# Patient Record
Sex: Male | Born: 1965 | Race: White | Hispanic: No | Marital: Married | Smoking: Current some day smoker
Health system: Southern US, Community
[De-identification: ages and names within clinical notes are randomized; demographics above are authoritative.]

## PROBLEM LIST (undated history)

## (undated) DIAGNOSIS — F4310 Post-traumatic stress disorder, unspecified: Secondary | ICD-10-CM

## (undated) DIAGNOSIS — Z9289 Personal history of other medical treatment: Secondary | ICD-10-CM

## (undated) DIAGNOSIS — F32A Depression, unspecified: Secondary | ICD-10-CM

## (undated) DIAGNOSIS — F329 Major depressive disorder, single episode, unspecified: Secondary | ICD-10-CM

## (undated) HISTORY — PX: TONSILLECTOMY: SUR1361

## (undated) HISTORY — PX: OTHER SURGICAL HISTORY: SHX169

---
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
## 2005-01-02 ENCOUNTER — Emergency Department (HOSPITAL_COMMUNITY): Admission: EM | Admit: 2005-01-02 | Discharge: 2005-01-02 | Payer: Self-pay | Admitting: Emergency Medicine

## 2008-11-20 HISTORY — PX: LEG AMPUTATION ABOVE KNEE: SHX117

## 2009-02-20 ENCOUNTER — Ambulatory Visit: Payer: Self-pay | Admitting: Surgery

## 2009-02-20 ENCOUNTER — Inpatient Hospital Stay (HOSPITAL_COMMUNITY): Admission: EM | Admit: 2009-02-20 | Discharge: 2009-03-03 | Payer: Self-pay | Admitting: Emergency Medicine

## 2009-04-12 ENCOUNTER — Inpatient Hospital Stay (HOSPITAL_COMMUNITY): Admission: RE | Admit: 2009-04-12 | Discharge: 2009-04-16 | Payer: Self-pay | Admitting: Orthopedic Surgery

## 2009-05-10 ENCOUNTER — Ambulatory Visit: Payer: Self-pay | Admitting: Vascular Surgery

## 2009-05-19 ENCOUNTER — Encounter: Admission: RE | Admit: 2009-05-19 | Discharge: 2009-08-17 | Payer: Self-pay | Admitting: Orthopedic Surgery

## 2009-06-04 ENCOUNTER — Emergency Department (HOSPITAL_COMMUNITY): Admission: EM | Admit: 2009-06-04 | Discharge: 2009-06-04 | Payer: Self-pay | Admitting: Emergency Medicine

## 2009-06-07 ENCOUNTER — Encounter: Admission: RE | Admit: 2009-06-07 | Discharge: 2009-06-07 | Payer: Self-pay | Admitting: Orthopedic Surgery

## 2009-06-17 ENCOUNTER — Ambulatory Visit: Payer: Self-pay | Admitting: Infectious Diseases

## 2009-06-17 DIAGNOSIS — M86669 Other chronic osteomyelitis, unspecified tibia and fibula: Secondary | ICD-10-CM | POA: Insufficient documentation

## 2009-06-24 ENCOUNTER — Ambulatory Visit: Payer: Self-pay | Admitting: Vascular Surgery

## 2009-06-25 ENCOUNTER — Telehealth: Payer: Self-pay | Admitting: Infectious Diseases

## 2009-08-19 ENCOUNTER — Encounter: Admission: RE | Admit: 2009-08-19 | Discharge: 2009-09-16 | Payer: Self-pay | Admitting: Orthopedic Surgery

## 2009-09-06 ENCOUNTER — Encounter: Admission: RE | Admit: 2009-09-06 | Discharge: 2009-09-06 | Payer: Self-pay | Admitting: Orthopedic Surgery

## 2010-12-07 ENCOUNTER — Ambulatory Visit: Payer: Self-pay | Admitting: Vascular Surgery

## 2011-02-07 ENCOUNTER — Ambulatory Visit (HOSPITAL_COMMUNITY)
Admission: RE | Admit: 2011-02-07 | Discharge: 2011-02-07 | Disposition: A | Discharge status: Home / Self Care | Payer: Medicaid Other | Source: Ambulatory Visit | Attending: Orthopedic Surgery | Admitting: Orthopedic Surgery

## 2011-02-07 ENCOUNTER — Other Ambulatory Visit (HOSPITAL_COMMUNITY): Payer: Self-pay | Admitting: Orthopedic Surgery

## 2011-02-07 ENCOUNTER — Encounter (HOSPITAL_COMMUNITY)
Admission: RE | Admit: 2011-02-07 | Discharge: 2011-02-07 | Disposition: A | Discharge status: Home / Self Care | Payer: Medicaid Other | Source: Ambulatory Visit | Attending: Orthopedic Surgery | Admitting: Orthopedic Surgery

## 2011-02-07 DIAGNOSIS — Z01810 Encounter for preprocedural cardiovascular examination: Secondary | ICD-10-CM | POA: Insufficient documentation

## 2011-02-07 DIAGNOSIS — Z01818 Encounter for other preprocedural examination: Secondary | ICD-10-CM | POA: Insufficient documentation

## 2011-02-07 DIAGNOSIS — Z01812 Encounter for preprocedural laboratory examination: Secondary | ICD-10-CM | POA: Insufficient documentation

## 2011-02-07 DIAGNOSIS — Z01811 Encounter for preprocedural respiratory examination: Secondary | ICD-10-CM

## 2011-02-07 LAB — TYPE AND SCREEN: ABO/RH(D): O POS

## 2011-02-07 LAB — COMPREHENSIVE METABOLIC PANEL
ALT: 16 U/L (ref 0–53)
AST: 19 U/L (ref 0–37)
Albumin: 4.1 g/dL (ref 3.5–5.2)
Alkaline Phosphatase: 91 U/L (ref 39–117)
CO2: 29 mEq/L (ref 19–32)
Chloride: 106 mEq/L (ref 96–112)
Creatinine, Ser: 0.84 mg/dL (ref 0.4–1.5)
GFR calc Af Amer: >60 mL/min (ref >60)
GFR calc non Af Amer: >60 mL/min (ref >60)
Potassium: 4.4 mEq/L (ref 3.5–5.1)
Sodium: 144 mEq/L (ref 135–145)
Total Bilirubin: 0.4 mg/dL (ref 0.3–1.2)

## 2011-02-07 LAB — URINALYSIS, ROUTINE W REFLEX MICROSCOPIC
Bilirubin Urine: NEGATIVE
Glucose, UA: NEGATIVE mg/dL
Hgb urine dipstick: NEGATIVE
Ketones, ur: NEGATIVE mg/dL
Protein, ur: NEGATIVE mg/dL
Urobilinogen, UA: 0.2 mg/dL (ref 0.0–1.0)

## 2011-02-07 LAB — DIFFERENTIAL
Eosinophils Relative: 2 % (ref 0–5)
Lymphocytes Relative: 38 % (ref 12–46)
Monocytes Absolute: 0.4 10*3/uL (ref 0.1–1.0)
Monocytes Relative: 6 % (ref 3–12)
Neutro Abs: 4.3 10*3/uL (ref 1.7–7.7)

## 2011-02-07 LAB — CBC
HCT: 43.7 % (ref 39.0–52.0)
Hemoglobin: 14.6 g/dL (ref 13.0–17.0)
MCH: 30.2 pg (ref 26.0–34.0)
MCHC: 33.4 g/dL (ref 30.0–36.0)
MCV: 90.3 fL (ref 78.0–100.0)
RDW: 13.4 % (ref 11.5–15.5)

## 2011-02-07 LAB — APTT: aPTT: 29 seconds (ref 24–37)

## 2011-02-08 LAB — URINE CULTURE
Colony Count: NO GROWTH
Culture  Setup Time: 201203201125

## 2011-02-09 ENCOUNTER — Inpatient Hospital Stay (HOSPITAL_COMMUNITY): Admit: 2011-02-09 | Payer: Self-pay | Admitting: Orthopedic Surgery

## 2011-02-09 ENCOUNTER — Inpatient Hospital Stay (HOSPITAL_COMMUNITY): Payer: Medicaid Other

## 2011-02-09 ENCOUNTER — Inpatient Hospital Stay (HOSPITAL_COMMUNITY)
Admission: RE | Admit: 2011-02-09 | Discharge: 2011-02-10 | DRG: 475 | Disposition: A | Discharge status: Home / Self Care | Payer: Medicaid Other | Source: Ambulatory Visit | Attending: Orthopedic Surgery | Admitting: Orthopedic Surgery

## 2011-02-09 ENCOUNTER — Other Ambulatory Visit: Payer: Self-pay | Admitting: Orthopedic Surgery

## 2011-02-09 DIAGNOSIS — S8290XS Unspecified fracture of unspecified lower leg, sequela: Secondary | ICD-10-CM

## 2011-02-09 DIAGNOSIS — M12569 Traumatic arthropathy, unspecified knee: Principal | ICD-10-CM | POA: Diagnosis present

## 2011-02-09 DIAGNOSIS — F411 Generalized anxiety disorder: Secondary | ICD-10-CM | POA: Diagnosis present

## 2011-02-09 DIAGNOSIS — M869 Osteomyelitis, unspecified: Secondary | ICD-10-CM | POA: Diagnosis present

## 2011-02-09 DIAGNOSIS — F17200 Nicotine dependence, unspecified, uncomplicated: Secondary | ICD-10-CM | POA: Diagnosis present

## 2011-02-09 DIAGNOSIS — IMO0002 Reserved for concepts with insufficient information to code with codable children: Secondary | ICD-10-CM | POA: Diagnosis present

## 2011-02-09 DIAGNOSIS — B9689 Other specified bacterial agents as the cause of diseases classified elsewhere: Secondary | ICD-10-CM | POA: Diagnosis present

## 2011-02-09 LAB — GRAM STAIN

## 2011-02-10 LAB — CBC
Hemoglobin: 10.8 g/dL — ABNORMAL LOW (ref 13.0–17.0)
MCH: 29 pg (ref 26.0–34.0)
MCHC: 32.5 g/dL (ref 30.0–36.0)
Platelets: 314 10*3/uL (ref 150–400)
RBC: 3.72 MIL/uL — ABNORMAL LOW (ref 4.22–5.81)

## 2011-02-10 LAB — COMPREHENSIVE METABOLIC PANEL
ALT: 13 U/L (ref 0–53)
AST: 14 U/L (ref 0–37)
Albumin: 3.4 g/dL — ABNORMAL LOW (ref 3.5–5.2)
CO2: 32 mEq/L (ref 19–32)
Calcium: 8.8 mg/dL (ref 8.4–10.5)
Chloride: 105 mEq/L (ref 96–112)
Creatinine, Ser: 0.67 mg/dL (ref 0.4–1.5)
GFR calc Af Amer: >60 mL/min (ref >60)
GFR calc non Af Amer: >60 mL/min (ref >60)
Sodium: 141 mEq/L (ref 135–145)

## 2011-02-11 LAB — WOUND CULTURE
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
## 2011-02-14 LAB — ANAEROBIC CULTURE

## 2011-02-28 LAB — BASIC METABOLIC PANEL
BUN: 6 mg/dL (ref 6–23)
BUN: 9 mg/dL (ref 6–23)
CO2: 31 mEq/L (ref 19–32)
Calcium: 8.1 mg/dL — ABNORMAL LOW (ref 8.4–10.5)
Chloride: 103 mEq/L (ref 96–112)
Chloride: 104 mEq/L (ref 96–112)
Creatinine, Ser: 0.83 mg/dL (ref 0.4–1.5)
Creatinine, Ser: 1 mg/dL (ref 0.4–1.5)
GFR calc Af Amer: >60 mL/min (ref >60)
GFR calc Af Amer: >60 mL/min (ref >60)
GFR calc non Af Amer: >60 mL/min (ref >60)
GFR calc non Af Amer: >60 mL/min (ref >60)
GFR calc non Af Amer: >60 mL/min (ref >60)
Glucose, Bld: 148 mg/dL — ABNORMAL HIGH (ref 70–99)
Potassium: 3.3 mEq/L — ABNORMAL LOW (ref 3.5–5.1)
Potassium: 3.3 mEq/L — ABNORMAL LOW (ref 3.5–5.1)
Potassium: 3.4 mEq/L — ABNORMAL LOW (ref 3.5–5.1)
Sodium: 141 mEq/L (ref 135–145)
Sodium: 141 mEq/L (ref 135–145)

## 2011-02-28 LAB — COMPREHENSIVE METABOLIC PANEL
ALT: 34 U/L (ref 0–53)
AST: 26 U/L (ref 0–37)
Alkaline Phosphatase: 104 U/L (ref 39–117)
CO2: 29 mEq/L (ref 19–32)
Chloride: 104 mEq/L (ref 96–112)
GFR calc Af Amer: >60 mL/min (ref >60)
GFR calc non Af Amer: >60 mL/min (ref >60)
Glucose, Bld: 103 mg/dL — ABNORMAL HIGH (ref 70–99)
Potassium: 4.2 mEq/L (ref 3.5–5.1)
Sodium: 142 mEq/L (ref 135–145)
Total Bilirubin: 0.5 mg/dL (ref 0.3–1.2)

## 2011-02-28 LAB — CBC
HCT: 21.9 % — ABNORMAL LOW (ref 39.0–52.0)
HCT: 24.8 % — ABNORMAL LOW (ref 39.0–52.0)
Hemoglobin: 14.3 g/dL (ref 13.0–17.0)
Hemoglobin: 7.6 g/dL — CL (ref 13.0–17.0)
Hemoglobin: 8.1 g/dL — ABNORMAL LOW (ref 13.0–17.0)
Hemoglobin: 8.6 g/dL — ABNORMAL LOW (ref 13.0–17.0)
MCV: 89.2 fL (ref 78.0–100.0)
Platelets: 314 10*3/uL (ref 150–400)
Platelets: 339 10*3/uL (ref 150–400)
RBC: 2.49 MIL/uL — ABNORMAL LOW (ref 4.22–5.81)
RBC: 2.7 MIL/uL — ABNORMAL LOW (ref 4.22–5.81)
RBC: 2.78 MIL/uL — ABNORMAL LOW (ref 4.22–5.81)
RBC: 4.77 MIL/uL (ref 4.22–5.81)
RDW: 13.7 % (ref 11.5–15.5)
RDW: 13.8 % (ref 11.5–15.5)
WBC: 10.9 10*3/uL — ABNORMAL HIGH (ref 4.0–10.5)
WBC: 7.8 10*3/uL (ref 4.0–10.5)
WBC: 8.2 10*3/uL (ref 4.0–10.5)

## 2011-02-28 LAB — POCT I-STAT EG7
Bicarbonate: 27.8 mEq/L — ABNORMAL HIGH (ref 20.0–24.0)
HCT: 31 % — ABNORMAL LOW (ref 39.0–52.0)
Hemoglobin: 10.5 g/dL — ABNORMAL LOW (ref 13.0–17.0)
pH, Ven: 7.389 — ABNORMAL HIGH (ref 7.250–7.300)
pO2, Ven: 44 mmHg (ref 30.0–45.0)

## 2011-02-28 LAB — CROSSMATCH

## 2011-02-28 LAB — HEMOGLOBIN AND HEMATOCRIT, BLOOD
HCT: 23.5 % — ABNORMAL LOW (ref 39.0–52.0)
HCT: 26.6 % — ABNORMAL LOW (ref 39.0–52.0)
Hemoglobin: 8.1 g/dL — ABNORMAL LOW (ref 13.0–17.0)

## 2011-02-28 LAB — NICOTINE/COTININE METABOLITES

## 2011-02-28 LAB — PROTIME-INR: Prothrombin Time: 13.5 seconds (ref 11.6–15.2)

## 2011-02-28 LAB — DIFFERENTIAL
Basophils Absolute: 0 10*3/uL (ref 0.0–0.1)
Basophils Relative: 1 % (ref 0–1)
Eosinophils Absolute: 0.2 10*3/uL (ref 0.0–0.7)
Eosinophils Relative: 3 % (ref 0–5)
Lymphs Abs: 2.8 10*3/uL (ref 0.7–4.0)
Neutrophils Relative %: 55 % (ref 43–77)

## 2011-03-01 LAB — POCT I-STAT 3, ART BLOOD GAS (G3+)
Acid-base deficit: 4 mmol/L — ABNORMAL HIGH (ref 0.0–2.0)
O2 Saturation: 98 %
TCO2: 23 mmol/L (ref 0–100)
pCO2 arterial: 45.1 mmHg — ABNORMAL HIGH (ref 35.0–45.0)
pO2, Arterial: 110 mmHg — ABNORMAL HIGH (ref 80.0–100.0)

## 2011-03-01 LAB — CBC
HCT: 22.3 % — ABNORMAL LOW (ref 39.0–52.0)
HCT: 25.2 % — ABNORMAL LOW (ref 39.0–52.0)
HCT: 26.6 % — ABNORMAL LOW (ref 39.0–52.0)
HCT: 27.7 % — ABNORMAL LOW (ref 39.0–52.0)
HCT: 40.7 % (ref 39.0–52.0)
Hemoglobin: 14.5 g/dL (ref 13.0–17.0)
Hemoglobin: 7.6 g/dL — CL (ref 13.0–17.0)
Hemoglobin: 8.6 g/dL — ABNORMAL LOW (ref 13.0–17.0)
Hemoglobin: 8.7 g/dL — ABNORMAL LOW (ref 13.0–17.0)
Hemoglobin: 8.7 g/dL — ABNORMAL LOW (ref 13.0–17.0)
Hemoglobin: 8.9 g/dL — ABNORMAL LOW (ref 13.0–17.0)
Hemoglobin: 8.9 g/dL — ABNORMAL LOW (ref 13.0–17.0)
Hemoglobin: 9 g/dL — ABNORMAL LOW (ref 13.0–17.0)
Hemoglobin: 9.7 g/dL — ABNORMAL LOW (ref 13.0–17.0)
MCHC: 33.7 g/dL (ref 30.0–36.0)
MCHC: 33.8 g/dL (ref 30.0–36.0)
MCHC: 34.4 g/dL (ref 30.0–36.0)
MCHC: 34.7 g/dL (ref 30.0–36.0)
MCHC: 34.7 g/dL (ref 30.0–36.0)
MCHC: 34.9 g/dL (ref 30.0–36.0)
MCHC: 35.2 g/dL (ref 30.0–36.0)
MCHC: 35.3 g/dL (ref 30.0–36.0)
MCHC: 35.3 g/dL (ref 30.0–36.0)
MCHC: 35.4 g/dL (ref 30.0–36.0)
MCV: 89.1 fL (ref 78.0–100.0)
MCV: 90.2 fL (ref 78.0–100.0)
MCV: 90.4 fL (ref 78.0–100.0)
MCV: 90.8 fL (ref 78.0–100.0)
MCV: 91.2 fL (ref 78.0–100.0)
MCV: 91.5 fL (ref 78.0–100.0)
MCV: 91.9 fL (ref 78.0–100.0)
Platelets: 198 10*3/uL (ref 150–400)
Platelets: 201 10*3/uL (ref 150–400)
Platelets: 202 10*3/uL (ref 150–400)
Platelets: 203 10*3/uL (ref 150–400)
Platelets: 227 10*3/uL (ref 150–400)
Platelets: 310 10*3/uL (ref 150–400)
Platelets: 315 10*3/uL (ref 150–400)
RBC: 2.32 MIL/uL — ABNORMAL LOW (ref 4.22–5.81)
RBC: 2.7 MIL/uL — ABNORMAL LOW (ref 4.22–5.81)
RBC: 2.72 MIL/uL — ABNORMAL LOW (ref 4.22–5.81)
RBC: 2.79 MIL/uL — ABNORMAL LOW (ref 4.22–5.81)
RBC: 2.95 MIL/uL — ABNORMAL LOW (ref 4.22–5.81)
RBC: 3.09 MIL/uL — ABNORMAL LOW (ref 4.22–5.81)
RDW: 12.8 % (ref 11.5–15.5)
RDW: 12.9 % (ref 11.5–15.5)
RDW: 12.9 % (ref 11.5–15.5)
RDW: 13 % (ref 11.5–15.5)
RDW: 13.1 % (ref 11.5–15.5)
RDW: 13.4 % (ref 11.5–15.5)
RDW: 13.6 % (ref 11.5–15.5)
RDW: 13.6 % (ref 11.5–15.5)
RDW: 13.9 % (ref 11.5–15.5)
RDW: 13.9 % (ref 11.5–15.5)
RDW: 14 % (ref 11.5–15.5)
RDW: 14.4 % (ref 11.5–15.5)
WBC: 10.2 10*3/uL (ref 4.0–10.5)
WBC: 10.6 10*3/uL — ABNORMAL HIGH (ref 4.0–10.5)
WBC: 10.8 10*3/uL — ABNORMAL HIGH (ref 4.0–10.5)
WBC: 11 10*3/uL — ABNORMAL HIGH (ref 4.0–10.5)
WBC: 12 10*3/uL — ABNORMAL HIGH (ref 4.0–10.5)

## 2011-03-01 LAB — DIFFERENTIAL
Basophils Absolute: 0 10*3/uL (ref 0.0–0.1)
Basophils Absolute: 0.1 10*3/uL (ref 0.0–0.1)
Basophils Relative: 0 % (ref 0–1)
Lymphs Abs: 6.5 10*3/uL — ABNORMAL HIGH (ref 0.7–4.0)
Monocytes Absolute: 0.7 10*3/uL (ref 0.1–1.0)
Monocytes Absolute: 0.8 10*3/uL (ref 0.1–1.0)
Monocytes Relative: 5 % (ref 3–12)
Neutro Abs: 6 10*3/uL (ref 1.7–7.7)
Neutro Abs: 6.9 10*3/uL (ref 1.7–7.7)
Neutrophils Relative %: 69 % (ref 43–77)

## 2011-03-01 LAB — BASIC METABOLIC PANEL
BUN: 15 mg/dL (ref 6–23)
BUN: 4 mg/dL — ABNORMAL LOW (ref 6–23)
BUN: 7 mg/dL (ref 6–23)
BUN: 8 mg/dL (ref 6–23)
CO2: 24 mEq/L (ref 19–32)
CO2: 26 mEq/L (ref 19–32)
CO2: 28 mEq/L (ref 19–32)
CO2: 28 mEq/L (ref 19–32)
CO2: 30 mEq/L (ref 19–32)
CO2: 31 mEq/L (ref 19–32)
CO2: 31 mEq/L (ref 19–32)
Calcium: 7.2 mg/dL — ABNORMAL LOW (ref 8.4–10.5)
Calcium: 7.4 mg/dL — ABNORMAL LOW (ref 8.4–10.5)
Calcium: 7.6 mg/dL — ABNORMAL LOW (ref 8.4–10.5)
Calcium: 7.8 mg/dL — ABNORMAL LOW (ref 8.4–10.5)
Calcium: 7.9 mg/dL — ABNORMAL LOW (ref 8.4–10.5)
Calcium: 8.5 mg/dL (ref 8.4–10.5)
Chloride: 101 mEq/L (ref 96–112)
Chloride: 103 mEq/L (ref 96–112)
Chloride: 107 mEq/L (ref 96–112)
Chloride: 98 mEq/L (ref 96–112)
Creatinine, Ser: 0.6 mg/dL (ref 0.4–1.5)
Creatinine, Ser: 0.78 mg/dL (ref 0.4–1.5)
Creatinine, Ser: 0.78 mg/dL (ref 0.4–1.5)
Creatinine, Ser: 0.79 mg/dL (ref 0.4–1.5)
GFR calc Af Amer: >60 mL/min (ref >60)
GFR calc Af Amer: >60 mL/min (ref >60)
GFR calc Af Amer: >60 mL/min (ref >60)
GFR calc Af Amer: >60 mL/min (ref >60)
GFR calc Af Amer: >60 mL/min (ref >60)
GFR calc non Af Amer: >60 mL/min (ref >60)
GFR calc non Af Amer: >60 mL/min (ref >60)
Glucose, Bld: 112 mg/dL — ABNORMAL HIGH (ref 70–99)
Glucose, Bld: 116 mg/dL — ABNORMAL HIGH (ref 70–99)
Glucose, Bld: 122 mg/dL — ABNORMAL HIGH (ref 70–99)
Glucose, Bld: 125 mg/dL — ABNORMAL HIGH (ref 70–99)
Glucose, Bld: 127 mg/dL — ABNORMAL HIGH (ref 70–99)
Glucose, Bld: 144 mg/dL — ABNORMAL HIGH (ref 70–99)
Glucose, Bld: 171 mg/dL — ABNORMAL HIGH (ref 70–99)
Potassium: 3.4 mEq/L — ABNORMAL LOW (ref 3.5–5.1)
Potassium: 3.6 mEq/L (ref 3.5–5.1)
Sodium: 134 mEq/L — ABNORMAL LOW (ref 135–145)
Sodium: 137 mEq/L (ref 135–145)
Sodium: 137 mEq/L (ref 135–145)
Sodium: 141 mEq/L (ref 135–145)

## 2011-03-01 LAB — VITAMIN B12: Vitamin B-12: 307 pg/mL (ref 211–911)

## 2011-03-01 LAB — TYPE AND SCREEN: Antibody Screen: NEGATIVE

## 2011-03-01 LAB — CROSSMATCH

## 2011-03-01 LAB — URINALYSIS, ROUTINE W REFLEX MICROSCOPIC
Bilirubin Urine: NEGATIVE
Glucose, UA: NEGATIVE mg/dL
Ketones, ur: >80 mg/dL — AB
Nitrite: NEGATIVE

## 2011-03-01 LAB — AMMONIA: Ammonia: 26 umol/L (ref 11–35)

## 2011-03-01 LAB — COMPREHENSIVE METABOLIC PANEL
ALT: 38 U/L (ref 0–53)
AST: 48 U/L — ABNORMAL HIGH (ref 0–37)
Albumin: 2.4 g/dL — ABNORMAL LOW (ref 3.5–5.2)
Calcium: 8.1 mg/dL — ABNORMAL LOW (ref 8.4–10.5)
Chloride: 104 mEq/L (ref 96–112)
Creatinine, Ser: 0.73 mg/dL (ref 0.4–1.5)
GFR calc Af Amer: >60 mL/min (ref >60)
Sodium: 137 mEq/L (ref 135–145)

## 2011-03-01 LAB — HEPATIC FUNCTION PANEL
AST: 61 U/L — ABNORMAL HIGH (ref 0–37)
Albumin: 2.3 g/dL — ABNORMAL LOW (ref 3.5–5.2)
Total Bilirubin: 1.1 mg/dL (ref 0.3–1.2)
Total Protein: 5.2 g/dL — ABNORMAL LOW (ref 6.0–8.3)

## 2011-03-01 LAB — URINE MICROSCOPIC-ADD ON

## 2011-03-01 LAB — POCT I-STAT 4, (NA,K, GLUC, HGB,HCT)
Glucose, Bld: 108 mg/dL — ABNORMAL HIGH (ref 70–99)
HCT: 32 % — ABNORMAL LOW (ref 39.0–52.0)
Hemoglobin: 10.9 g/dL — ABNORMAL LOW (ref 13.0–17.0)
Sodium: 140 mEq/L (ref 135–145)

## 2011-03-01 LAB — PREPARE RBC (CROSSMATCH)

## 2011-03-01 LAB — URINE CULTURE: Colony Count: NO GROWTH

## 2011-03-01 LAB — ABO/RH: ABO/RH(D): O POS

## 2011-03-01 LAB — RPR: RPR Ser Ql: NONREACTIVE

## 2011-04-04 NOTE — Consult Note (Signed)
NAMECONER, GIBBARD             ACCOUNT NO.:  [id number]   MEDICAL RECORD NO.:  [id number]          PATIENT TYPE:  INP   LOCATION:  5005                         FACILITY:  [facility]   PHYSICIAN:  Antonietta Breach, M.D.  DATE OF BIRTH:  Jan 14, 1966   DATE OF CONSULTATION:  02/26/2009  DATE OF DISCHARGE:  03/03/2009                                 CONSULTATION   REQUESTING PHYSICIAN:  Cherylynn Ridges, M.D.   REASON FOR CONSULTATION:  Alcohol withdrawal.   Mr. Jablonsky was started on the Ativan withdrawal protocol for alcohol  withdrawal.  He was having an adverse reaction; therefore, an  alternative to Ativan is requested.  Mr. Mcglocklin was initially having some  confusion and disorientation.  These have now resolved.  His mood is  normal.  His interests are normal.  He is not having any hallucinations  or delusions.  His orientation and memory function are intact.  He was  switched to __________ for his withdrawal protocol with good results.   MENTAL STATUS EXAM:  Mr. Auld is alert.  He is oriented to all spheres.  His affect is broad and appropriate.  His mood is within normal limits.  Thought process is logical, coherent, goal-directed.  Thought content:  No thoughts of harming himself or others, no delusions, no  hallucinations.  Insight is intact; judgment is intact.   ASSESSMENT:  Alcohol dependence.  AXIS II:  Deferred.  AXIS III:  See past medical history.  His withdrawal delirium has  resolved.  AXIS IV:  Primary support group.  AXIS V:  55.   Mr. Carrero agrees to call emergency services as needed.  Mr. Burget declines  any form of chemical dependence rehabilitation.  If he does change his  mind, would ask the social worker to set up the [employer] and  provide 12-step materials and information on groups.      Antonietta Breach, M.D.  Electronically Signed     JW/MEDQ  D:  04/11/2009  T:  04/11/2009  Job:  [id number]
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
## 2011-04-04 NOTE — Op Note (Signed)
[name], [name]             ACCOUNT NO.:  [id number]   MEDICAL RECORD NO.:  [id number]          PATIENT TYPE:  INP   LOCATION:  2305                         FACILITY:  [facility]   PHYSICIAN:  Doralee Albino. Carola Frost, M.D. DATE OF BIRTH:  13-Aug-1966   DATE OF PROCEDURE:  02/23/2009  DATE OF DISCHARGE:                               OPERATIVE REPORT   PREOPERATIVE DIAGNOSES:  1. Loss of reduction, left tibial plateau fracture and tibial shaft      fracture.  2. Open lateral fasciotomy of the leg.  3. Status post fascia repair, left leg.   POSTOPERATIVE DIAGNOSES:  1. Loss of reduction, left tibial plateau fracture and tibial shaft      fracture.  2. Open lateral fasciotomy of the leg.  3. Status post fascia repair, left leg.   PROCEDURES:  1. Revision of external fixator under anesthesia with re-reduction of      both the tibial shaft and plateau with complete removal and      replacement of the frame.  2. Application of wound VAC to the left lateral fasciotomy.   SURGEON:  Doralee Albino. Handy, MD   ASSISTANT:  Mearl Latin, PA-C   ANESTHESIA:  General.   FINDINGS:  Intact dorsalis pedis and posterior tibialis pulses  postoperatively with intact posterior tibialis pulse preoperatively and  faint dopplerable dorsalis pedis preoperatively.   ESTIMATED BLOOD LOSS:  Minimal.   DRAINS:  JP drain was left in from prior procedure.   BRIEF SUMMARY OF INDICATIONS FOR PROCEDURE:  [name] is a 45 year old  male, status post severe left tibial plateau fracture with a vascular  injury treated with repair by Dr. Myra Gianotti and spanning external fixation  by Dr. Cleophas Dunker.  There is noted to be some motion in the frame  construct and some loss of reduction.  I discussed with the patient and  his family the risks and benefits of revision of the external fixator  frame including loss of continuity of the vascular repair, need for re-  repair, open fracture, other skin injury, and multiple others  including  DVT, PE, heart attack, and stroke.  After full discussion, wished to  proceed.   DESCRIPTION OF PROCEDURE:  [name] was taken to the operating room where  general anesthesia was induced.  He was then transferred to the  operating table.  On transfer, we did find instability of the frame with  rotation about the fracture site.  The current construct was not  salvable and consequently the entire fixator was removed down to the  pins.  After checking and confirming placement of the pins on multiple  images, both at the tibial shaft and at the tibial plateau, the fracture  was reduced at the shaft and secured with two short rods from the Combi  clamps.  While I held the reduction, [name] secured the frame.  We  then repeated this proximally with distraction at the fracture site, and  placement of a series of bumps and correction of his overall alignment  with securing of the provisional frame and then the running of one long  rod from the lower frame across the knee to the upper one.  Final images  were confirmed.  Some manipulation was performed of the fracture at the  plateau trying to push the bone fragments away from the skin in areas  where they were tenting.  This was only partially successful, but the  manipulation and re-reduction definitely interdigitated tibial shaft and  improved the alignment of the tibial plateau.  The wound VAC was then  removed from the right leg and examined to see if it could be closed.  It clearly could not.  It was then scrubbed with a chlorhexidine scrub  brush.  Irrigation and lavage performed and then the wound VAC reapplied  after a sterile prep and drape.  Sterile gently compressive Ace wraps  were applied from foot to thigh after rechecking the dopplerable  dorsalis pedis and posterior tibialis pulses with findings as discussed  above.  Montez Morita, PA-C, assisted throughout the procedures where he  was absolutely necessary for their safe  and effective completion.   PROGNOSIS:  [name] has had severe injury to his leg and knee and will  require reconstruction.  At this time, the x-ray and CT scan are so  severe that the injury appears as if it may not be reconstructable.  I  am concerned about the risk of deep infection with the fasciotomies of  some of the skin eschar and the extended exposure would be required for  reconstruction of his fragments.  His smoking habit further increases  his risk.  Also remains at high risk for DVT, PE, and other  complications.  He will require skin grafting most likely, but we may  defer this until later checking with either Plastic Surgery colleagues  or Dr. Myra Gianotti regarding sequential attempt at closure versus proceeding  directly to the split-thickness skin grafting.      Doralee Albino. Carola Frost, M.D.  Electronically Signed     MHH/MEDQ  D:  02/23/2009  T:  02/24/2009  Job:  [id number]
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
## 2011-04-04 NOTE — Op Note (Signed)
[name], [name]             ACCOUNT NO.:  [id number]   MEDICAL RECORD NO.:  [id number]           PATIENT TYPE:   LOCATION:                                 FACILITY:   PHYSICIAN:  Doralee Albino. Carola Frost, M.D. DATE OF BIRTH:  [birth year]   DATE OF PROCEDURE:  04/12/2009  DATE OF DISCHARGE:                               OPERATIVE REPORT   PREOPERATIVE DIAGNOSES:  1. Left bicondylar tibial plateau fracture.  2. Retained external fixator.  3. Patellar tendon bony avulsion.  4. Tibial eminence fracture.  5. Tibial shaft fracture.  6. Knee contracture.  7. Pin tract infection, femur.  8. Pin tract infection, tibia.   POSTOPERATIVE DIAGNOSES:  1. Left bicondylar tibial plateau fracture.  2. Retained external fixator.  3. Patellar tendon bony avulsion.  4. Tibial eminence fracture.  5. Tibial shaft fracture.  6. Knee contracture.  7. Pin tract infection, femur.  8. Pin tract infection, tibia.  9. Lateral meniscus tear.   PROCEDURES:  1. ORIF of bicondylar tibial plateau,  2. Repair of lateral meniscus through an arthrotomy.  3. Repair tibial eminence/spine fracture.  4. Repair of patellar tendon with suture.  5. Tenolysis of the quadriceps tendon.  6. Curettage of the bone in the femur.  7. Curettage of the bone in the tibia.  8. Stress fluoroscopy of the tibial shaft.  9. Removal of external fixator under anesthesia.   SURGEON:  Doralee Albino. Carola Frost, MD   ASSISTANT:  Mearl Latin, PA   ANESTHESIA:  General.   FINDINGS:  Severe comminution of bicondylar tibial plateau fracture.  Some areas of partial healing tibial plateau, lateral compartment within  the knee joint anterior to the femoral condyle and patellar tendon  proximal to that, and 4900 of LR, 1000 of Hespan, 900 mL estimated blood  loss, 900 mL urine.   COMPLICATIONS:  None.   TOURNIQUET:  None.   DRAINS:  One.   DISPOSITION:  PACU.   CONDITION:  Stable.   BRIEF SUMMARY OF INDICATIONS FOR PROCEDURE:   [name] is a 45-  year-old male status post severely comminuted tibial plateau fracture  treated with application of a spanning frame and treatment of soft  tissue eschars to allow for resolution of swelling and the envelopes so  that we could proceed with surgery.  I discussed with him preoperatively  the risks and benefits of surgery including the possibility of  infection, nerve injury, vessel injury, DVT, PE, heart attack, stroke,  need for further surgery, failure of repair of malunion, eventual need  for revision surgery, and multiple others.  Of course, our greatest  concern was infection.  He understood all these concerns and risks and  wished to proceed.   BRIEF DESCRIPTION OF PROCEDURE:  [name] was taken to the OR where  general anesthesia was induced.  His left lower extremity was then  scrubbed with chlorhexidine scrub brush and also some debridement of the  skin from previous bulla and eschars.  The external fixator was left in  place spanning the tibial shaft component as I was concerned that during  prep  this could be disrupted and lead to the need for further  procedures.  The external fixator was then removed at that time from the  thigh and the bars connecting it to the upper portion of the lamina.  Standard prep and drape was then performed.   I began with removal of the fixator and thoroughly.  I debrided the  skin, subcutaneous tissue, surrounding muscle, and also curettaged the  bone aggressively to remove the tract as the patient had pin tract  infections of both the tibia and the femur.  Once this was done, we  irrigated thoroughly, and the C-arm was brought in and stress  fluoroscopy performed of the tibial shaft fracture site.  No motion was  identified.  Fracture remained in excellent alignment.   Another prep and drape was then performed, and tendon and the pin sites  protected from the operative field.  A medial approach was then made  through Dr.  Estanislado Spire previous vascular repair incision and dissection  carried carefully down.  I identified the saphenous vein tie-off and  then went down to the bone identifying the hamstring tendons and freeing  them.  I identified a large medial block and then packed this wound and  turned my attention away from the medial plateau to the lateral side.  A  curvilinear incision was made from the previous fasciotomy above the IT  band.  This was incised.  Dissection carried down to the level of the  joint or below it as there was no joint line available because the  entire lateral plateau had been displaced into the knee joint.  Nonetheless, I entered the bony compartment at that level and identified  a severely depressed portion of the proximal tibia.  Again, I could not  identify the lateral joint space at that time.  I did see the lateral  meniscus that was depressed down within the debris, mobilize it, and  passed sutures for later repair to the lateral capsule.  The imminence  portion was mobile and extensively comminuted as well.  I then continued  dissection up into the anterior aspect of the knee and identified a  large block of bone which I thought was the tibial tubercle but was in  fact the lateral plateau consisting of a fairly large subchondral block.  This block of bone contained the articular surface to the fibular head  as well.  I was able to free it, mobilize it.  This revealed that the  patellar tendon in fact consisted of multiple comminuted fragments and  had avulsed from the tibia.  Grasping this component, I was unable to  bring it out to length.  Consequently, I had to perform a quadrilysis  using a large Cobb to free off the quadriceps tendon and sequentially  mobilize the patellar tendon and patella.  Once this was adequately  mobilized, I turned my attention back to the medial side.  A buttress  plate had to be placed to restore appropriate length and rotation and   translation of that fragment despite excessively mobilizing it.  The  [employer] posteromedial plate was used resulting in excellent apposition.  The lateral side could not be reconstructed anatomically and had  considerable difficulty getting the lateral plateau segment to reduce  despite a variety of measures.  Ultimately, a #5 FiberWire was used to  secure the posterior aspect of the knee joint and brought through the  lateral plateau into some of the anterior bone and tied in  this fashion  while supplementing this with K-wires and as much posterior-directed  force as we could apply.  As viewed through the lateral incision, this  actually resulted in a very solid apposition of the bone against the  lateral distal femur and restored a very good articular congruity.  Again, there were very few anatomic landmarks to go off given the severe  comminution.  After restoring the medial and lateral articular blocks, I  then used the [employer] clamp over the lateral plate and achieved  compression across the articular surface.  We were very careful to see  watch and control our alignment throughout and get it as close to an  anatomic axis of 60 degrees as possible.  The patellar tendon was then  mobilized and repaired with a #5 FiberWire.  I also placed 1 screw over  one of these fragments of bone anteriorly securing good fixation in the  far posterior cortex.  The lateral meniscus sutures were tied and these  were repaired with Prolene.  Following this, we irrigated thoroughly,  place a drain in the anterior compartment and performed a standard  layered closure.  The procedure was exceedingly difficult at all stages  and required 9 hours and the assistance of Montez Morita, PA-C, with all  portions for a safe and effective inexpedient completion.  The patient  was then awakened from anesthesia and transported to the PACU in stable  condition after application of sterile dressing and knee  immobilizer.   PROGNOSIS:  Our primary goal with Mr. Mccambridge was to restore his extensor  mechanism so that eventually should his knee develop degenerative  arthritis he could undergo a total knee arthroplasty, second goal was to  restore appropriate alignment which was obtained clinically in the OR  and as visualized through the surgical incisions, but remained  suboptimal on radiographic examination.  He does have his lateral  meniscus which was retained and repaired and also has quite reasonable  articular congruity.  He will be on DVT prophylaxis with Lovenox.  He is  of course at high risk for postoperative infection given his previous  fasciotomies as well as the pin tract infections.  Also, discontinue  soft tissues and eschars, some of which over the medial aspect of the  joint never healed.      Doralee Albino. Carola Frost, M.D.  Electronically Signed     MHH/MEDQ  D:  04/16/2009  T:  04/17/2009  Job:  [id number]
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
## 2011-04-04 NOTE — Op Note (Signed)
NAMESHIELDS, PAUTZ             ACCOUNT NO.:  [id number]   MEDICAL RECORD NO.:  [id number]          PATIENT TYPE:  INP   LOCATION:  2550                         FACILITY:  [facility]   PHYSICIAN:  Claude Manges. Whitfield, M.D.DATE OF BIRTH:  May 01, 1966   DATE OF PROCEDURE:  02/20/2009  DATE OF DISCHARGE:                               OPERATIVE REPORT   PREOPERATIVE DIAGNOSES:  Close segmental left tibia fracture with  comminuted intra-articular extension of proximal tibia with significant  displacement and an essentially nondisplaced midshaft fracture.   POSTOPERATIVE DIAGNOSES:  Close segmental left tibia fracture with  comminuted intra-articular extension of proximal tibia with significant  displacement and an essentially nondisplaced midshaft fracture.   PROCEDURE:  Application of spanning external fixator with closed  reduction and stabilization of left tibial fracture.   SURGEON:  Claude Manges. Cleophas Dunker, MD   ASSISTANT:  Oris Drone. Petrarca, PA-C   ANESTHESIA:  General.   PROCEDURE:  [name] was brought to the operating room and placed under  general orotracheal anesthesia.  Both lower extremities were prepped  with Betadine scrub and then Betadine solution from the groin to the tip  of toes and sterile draping was performed.   There was concern of vascular compromise to the left lower extremity and  Dr. Myra Gianotti will evaluate the patient after application of the external  fixator.  There were no palpable pulses in the right foot although there  was some to the left foot but there was some capillary refill.  Preoperatively, there was a concern that he may have some peroneal nerve  injury.  The proximal tibia fracture was significantly comminuted,  displaced and intra-articular and the segmental portion involving the  midportion of the tibia was nondisplaced.   The [employer]ynthe[employer] external fixator system was utilized.  Two Shantz pins were  then placed in the distal tibia distal to the  midshaft fracture.  Two  Shantz pins were then placed in the segmental portion of the middle  tibia between the midshaft and the proximal fracture and two the Shantz  pins were placed in the mid to distal third of the femur.  Under direct  intensification guidance, we localized the appropriate areas and made  small skin incision.  The Shantz pins were inserted using the guide and  the sheath.  Each pin was guided with image intensification.  At that  point, the fixator was assembled.  Retraction was applied to the leg and  the system was then tightened. There was considerable comminution of the  proximal tibia which we could not completely reduce but we thought the  alignment was significantly improved and obviously quite stable.  At  that point, the toe was a little bit pink but we still could not obtain  dorsalis pedis and posterior tibial pulses.  Dr. Myra Gianotti plans on  exploring the arterial system about the knee where the fracture was  displaced.   From our standpoint, the patient did well.  There were no problems  during our part of the procedure, and we will follow the patient during  this hospitalization.  Claude Manges. Cleophas Dunker, M.D.  Electronically Signed     PWW/MEDQ  D:  02/20/2009  T:  02/20/2009  Job:  [id number]
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
## 2011-05-18 ENCOUNTER — Ambulatory Visit: Payer: Non-veteran care | Attending: Orthopedic Surgery | Admitting: Physical Therapy

## 2011-05-18 DIAGNOSIS — R5381 Other malaise: Secondary | ICD-10-CM | POA: Insufficient documentation

## 2011-05-18 DIAGNOSIS — S78119A Complete traumatic amputation at level between unspecified hip and knee, initial encounter: Secondary | ICD-10-CM | POA: Insufficient documentation

## 2011-05-18 DIAGNOSIS — IMO0001 Reserved for inherently not codable concepts without codable children: Secondary | ICD-10-CM | POA: Insufficient documentation

## 2011-05-18 DIAGNOSIS — R269 Unspecified abnormalities of gait and mobility: Secondary | ICD-10-CM | POA: Insufficient documentation

## 2011-05-23 ENCOUNTER — Ambulatory Visit: Payer: Non-veteran care | Attending: Orthopedic Surgery | Admitting: Physical Therapy

## 2011-05-23 DIAGNOSIS — R5381 Other malaise: Secondary | ICD-10-CM | POA: Insufficient documentation

## 2011-05-23 DIAGNOSIS — IMO0001 Reserved for inherently not codable concepts without codable children: Secondary | ICD-10-CM | POA: Insufficient documentation

## 2011-05-23 DIAGNOSIS — R269 Unspecified abnormalities of gait and mobility: Secondary | ICD-10-CM | POA: Insufficient documentation

## 2011-05-23 DIAGNOSIS — S78119A Complete traumatic amputation at level between unspecified hip and knee, initial encounter: Secondary | ICD-10-CM | POA: Insufficient documentation

## 2011-05-26 ENCOUNTER — Ambulatory Visit: Payer: Non-veteran care | Admitting: Physical Therapy

## 2011-05-30 ENCOUNTER — Encounter: Payer: Non-veteran care | Admitting: Physical Therapy

## 2011-06-02 ENCOUNTER — Ambulatory Visit: Payer: Non-veteran care | Admitting: Physical Therapy

## 2011-06-06 ENCOUNTER — Ambulatory Visit: Payer: Non-veteran care | Admitting: Physical Therapy

## 2011-06-13 ENCOUNTER — Ambulatory Visit: Payer: Non-veteran care | Admitting: Physical Therapy

## 2011-06-20 ENCOUNTER — Encounter: Payer: Medicaid Other | Admitting: Physical Therapy

## 2011-06-27 ENCOUNTER — Ambulatory Visit: Payer: Non-veteran care | Attending: Orthopedic Surgery | Admitting: Physical Therapy

## 2011-06-27 DIAGNOSIS — IMO0001 Reserved for inherently not codable concepts without codable children: Secondary | ICD-10-CM | POA: Insufficient documentation

## 2011-06-27 DIAGNOSIS — S78119A Complete traumatic amputation at level between unspecified hip and knee, initial encounter: Secondary | ICD-10-CM | POA: Insufficient documentation

## 2011-06-27 DIAGNOSIS — R269 Unspecified abnormalities of gait and mobility: Secondary | ICD-10-CM | POA: Insufficient documentation

## 2011-06-27 DIAGNOSIS — R5381 Other malaise: Secondary | ICD-10-CM | POA: Insufficient documentation

## 2011-07-04 ENCOUNTER — Ambulatory Visit: Payer: Non-veteran care | Admitting: Physical Therapy

## 2011-07-11 ENCOUNTER — Encounter: Payer: Medicaid Other | Admitting: Physical Therapy

## 2012-04-23 ENCOUNTER — Ambulatory Visit: Payer: Non-veteran care | Attending: Student | Admitting: Physical Therapy

## 2012-04-23 DIAGNOSIS — R5381 Other malaise: Secondary | ICD-10-CM | POA: Insufficient documentation

## 2012-04-23 DIAGNOSIS — S78119A Complete traumatic amputation at level between unspecified hip and knee, initial encounter: Secondary | ICD-10-CM | POA: Insufficient documentation

## 2012-04-23 DIAGNOSIS — R269 Unspecified abnormalities of gait and mobility: Secondary | ICD-10-CM | POA: Insufficient documentation

## 2012-04-23 DIAGNOSIS — IMO0001 Reserved for inherently not codable concepts without codable children: Secondary | ICD-10-CM | POA: Insufficient documentation

## 2012-05-01 ENCOUNTER — Ambulatory Visit: Payer: Non-veteran care | Admitting: Physical Therapy

## 2012-05-15 ENCOUNTER — Ambulatory Visit: Payer: Non-veteran care | Admitting: Physical Therapy

## 2012-05-16 ENCOUNTER — Other Ambulatory Visit: Payer: Self-pay | Admitting: Orthopedic Surgery

## 2012-05-16 DIAGNOSIS — M25521 Pain in right elbow: Secondary | ICD-10-CM

## 2012-05-20 ENCOUNTER — Ambulatory Visit
Admission: RE | Admit: 2012-05-20 | Discharge: 2012-05-20 | Disposition: A | Discharge status: Home / Self Care | Payer: Non-veteran care | Source: Ambulatory Visit | Attending: Orthopedic Surgery | Admitting: Orthopedic Surgery

## 2012-05-20 ENCOUNTER — Encounter (HOSPITAL_COMMUNITY): Payer: Self-pay | Admitting: *Deleted

## 2012-05-20 ENCOUNTER — Encounter (HOSPITAL_COMMUNITY): Payer: Self-pay | Admitting: Pharmacy Technician

## 2012-05-20 DIAGNOSIS — M25521 Pain in right elbow: Secondary | ICD-10-CM

## 2012-05-21 ENCOUNTER — Ambulatory Visit (HOSPITAL_COMMUNITY)
Admission: RE | Admit: 2012-05-21 | Discharge: 2012-05-21 | Disposition: A | Discharge status: Home / Self Care | Payer: Medicare Other | Source: Ambulatory Visit | Attending: Orthopedic Surgery | Admitting: Orthopedic Surgery

## 2012-05-21 ENCOUNTER — Encounter (HOSPITAL_COMMUNITY): Payer: Self-pay | Admitting: *Deleted

## 2012-05-21 ENCOUNTER — Ambulatory Visit (HOSPITAL_COMMUNITY): Payer: Medicare Other

## 2012-05-21 ENCOUNTER — Encounter (HOSPITAL_COMMUNITY): Payer: Self-pay | Admitting: Anesthesiology

## 2012-05-21 ENCOUNTER — Encounter (HOSPITAL_COMMUNITY)
Admission: RE | Disposition: A | Discharge status: Home / Self Care | Payer: Self-pay | Source: Ambulatory Visit | Attending: Orthopedic Surgery

## 2012-05-21 ENCOUNTER — Ambulatory Visit (HOSPITAL_COMMUNITY): Payer: Medicare Other | Admitting: Anesthesiology

## 2012-05-21 DIAGNOSIS — S43499A Other sprain of unspecified shoulder joint, initial encounter: Secondary | ICD-10-CM | POA: Insufficient documentation

## 2012-05-21 DIAGNOSIS — Y9359 Activity, other involving other sports and athletics played individually: Secondary | ICD-10-CM | POA: Insufficient documentation

## 2012-05-21 DIAGNOSIS — F329 Major depressive disorder, single episode, unspecified: Secondary | ICD-10-CM | POA: Insufficient documentation

## 2012-05-21 DIAGNOSIS — S46819A Strain of other muscles, fascia and tendons at shoulder and upper arm level, unspecified arm, initial encounter: Secondary | ICD-10-CM | POA: Insufficient documentation

## 2012-05-21 DIAGNOSIS — F4310 Post-traumatic stress disorder, unspecified: Secondary | ICD-10-CM | POA: Insufficient documentation

## 2012-05-21 DIAGNOSIS — M86669 Other chronic osteomyelitis, unspecified tibia and fibula: Secondary | ICD-10-CM

## 2012-05-21 DIAGNOSIS — F3289 Other specified depressive episodes: Secondary | ICD-10-CM | POA: Insufficient documentation

## 2012-05-21 DIAGNOSIS — S78119A Complete traumatic amputation at level between unspecified hip and knee, initial encounter: Secondary | ICD-10-CM | POA: Insufficient documentation

## 2012-05-21 DIAGNOSIS — Y929 Unspecified place or not applicable: Secondary | ICD-10-CM | POA: Insufficient documentation

## 2012-05-21 DIAGNOSIS — X503XXA Overexertion from repetitive movements, initial encounter: Secondary | ICD-10-CM | POA: Insufficient documentation

## 2012-05-21 HISTORY — DX: Major depressive disorder, single episode, unspecified: F32.9

## 2012-05-21 HISTORY — DX: Post-traumatic stress disorder, unspecified: F43.10

## 2012-05-21 HISTORY — PX: DISTAL BICEPS TENDON REPAIR: SHX1461

## 2012-05-21 HISTORY — DX: Depression, unspecified: F32.A

## 2012-05-21 HISTORY — DX: Personal history of other medical treatment: Z92.89

## 2012-05-21 LAB — CBC
MCV: 91.8 fL (ref 78.0–100.0)
Platelets: 282 10*3/uL (ref 150–400)
RBC: 4.78 MIL/uL (ref 4.22–5.81)
WBC: 7.6 10*3/uL (ref 4.0–10.5)

## 2012-05-21 LAB — SURGICAL PCR SCREEN
MRSA, PCR: NEGATIVE
Staphylococcus aureus: NEGATIVE

## 2012-05-21 SURGERY — REPAIR, TENDON, BICEPS, DISTAL
Anesthesia: General | Site: Arm Upper | Laterality: Right | Wound class: Clean

## 2012-05-21 MED ORDER — FENTANYL CITRATE 0.05 MG/ML IJ SOLN
INTRAMUSCULAR | Status: DC | PRN
  Administered 2012-05-21 (×3): 50 ug via INTRAVENOUS
  Administered 2012-05-21: 100 ug via INTRAVENOUS
  Administered 2012-05-21 (×2): 50 ug via INTRAVENOUS

## 2012-05-21 MED ORDER — DROPERIDOL 2.5 MG/ML IJ SOLN: 0.6250 mg | INTRAMUSCULAR | Status: DC | PRN

## 2012-05-21 MED ORDER — LACTATED RINGERS IV SOLN
INTRAVENOUS | Status: DC | PRN
  Administered 2012-05-21 (×2): via INTRAVENOUS

## 2012-05-21 MED ORDER — ROCURONIUM BROMIDE 100 MG/10ML IV SOLN
INTRAVENOUS | Status: DC | PRN
  Administered 2012-05-21: 50 mg via INTRAVENOUS

## 2012-05-21 MED ORDER — HYDROMORPHONE HCL PF 1 MG/ML IJ SOLN
INTRAMUSCULAR | Status: AC
  Filled 2012-05-21: qty 2

## 2012-05-21 MED ORDER — 0.9 % SODIUM CHLORIDE (POUR BTL) OPTIME
TOPICAL | Status: DC | PRN
  Administered 2012-05-21: 1000 mL

## 2012-05-21 MED ORDER — MIDAZOLAM HCL 5 MG/5ML IJ SOLN
INTRAMUSCULAR | Status: DC | PRN
  Administered 2012-05-21: 2 mg via INTRAVENOUS

## 2012-05-21 MED ORDER — HYDROMORPHONE HCL PF 1 MG/ML IJ SOLN
0.2500 mg | INTRAMUSCULAR | Status: DC | PRN
  Administered 2012-05-21 (×6): 0.5 mg via INTRAVENOUS

## 2012-05-21 MED ORDER — LIDOCAINE HCL (CARDIAC) 20 MG/ML IV SOLN
INTRAVENOUS | Status: DC | PRN
  Administered 2012-05-21: 100 mg via INTRAVENOUS

## 2012-05-21 MED ORDER — MUPIROCIN 2 % EX OINT
TOPICAL_OINTMENT | CUTANEOUS | Status: AC
  Filled 2012-05-21: qty 22

## 2012-05-21 MED ORDER — ONDANSETRON HCL 4 MG PO TABS: 4.0000 mg | ORAL_TABLET | Freq: Three times a day (TID) | ORAL | Status: AC | PRN

## 2012-05-21 MED ORDER — OXYCODONE-ACETAMINOPHEN 5-325 MG PO TABS: 1.0000 | ORAL_TABLET | Freq: Four times a day (QID) | ORAL | Status: AC | PRN

## 2012-05-21 MED ORDER — OXYCODONE HCL 5 MG PO TABS: 5.0000 mg | ORAL_TABLET | ORAL | Status: DC | PRN

## 2012-05-21 MED ORDER — HYDROMORPHONE HCL PF 1 MG/ML IJ SOLN
INTRAMUSCULAR | Status: AC
  Filled 2012-05-21: qty 1

## 2012-05-21 MED ORDER — MUPIROCIN 2 % EX OINT
TOPICAL_OINTMENT | Freq: Once | CUTANEOUS | Status: AC
  Administered 2012-05-21: 06:00:00 via NASAL
  Filled 2012-05-21: qty 22

## 2012-05-21 MED ORDER — ONDANSETRON HCL 4 MG/2ML IJ SOLN
INTRAMUSCULAR | Status: DC | PRN
  Administered 2012-05-21: 4 mg via INTRAVENOUS

## 2012-05-21 MED ORDER — PROPOFOL 10 MG/ML IV EMUL
INTRAVENOUS | Status: DC | PRN
  Administered 2012-05-21: 200 mg via INTRAVENOUS

## 2012-05-21 MED ORDER — DEXTROSE 5 % IV SOLN
3.0000 g | INTRAVENOUS | Status: AC
  Administered 2012-05-21: 3 g via INTRAVENOUS
  Filled 2012-05-21: qty 30

## 2012-05-21 MED ORDER — LACTATED RINGERS IV SOLN: INTRAVENOUS | Status: DC

## 2012-05-21 MED ORDER — MEPERIDINE HCL 25 MG/ML IJ SOLN
INTRAMUSCULAR | Status: AC
  Filled 2012-05-21: qty 1

## 2012-05-21 MED ORDER — CHLORHEXIDINE GLUCONATE 4 % EX LIQD: 60.0000 mL | Freq: Once | CUTANEOUS | Status: DC

## 2012-05-21 SURGICAL SUPPLY — 60 items
ANCH SUT 2 CRKSCW 12X3.5 EYLT (Anchor) ×1 IMPLANT
ANCHOR CORKSCREW 3.5 FIBERWIRE (Anchor) ×1 IMPLANT
BANDAGE ELASTIC 3 VELCRO ST LF (GAUZE/BANDAGES/DRESSINGS) IMPLANT
BANDAGE GAUZE ELAST BULKY 4 IN (GAUZE/BANDAGES/DRESSINGS) IMPLANT
BNDG CMPR 9X4 STRL LF SNTH (GAUZE/BANDAGES/DRESSINGS) ×1
BNDG ESMARK 4X9 LF (GAUZE/BANDAGES/DRESSINGS) ×2 IMPLANT
BUR ROUND FLUTED 4 SOFT TCH (BURR) ×1 IMPLANT
CLOTH BEACON ORANGE TIMEOUT ST (SAFETY) ×2 IMPLANT
CORDS BIPOLAR (ELECTRODE) ×2 IMPLANT
COVER SURGICAL LIGHT HANDLE (MISCELLANEOUS) ×2 IMPLANT
CUFF TOURNIQUET SINGLE 18IN (TOURNIQUET CUFF) ×1 IMPLANT
CUFF TOURNIQUET SINGLE 24IN (TOURNIQUET CUFF) ×1 IMPLANT
DECANTER SPIKE VIAL GLASS SM (MISCELLANEOUS) IMPLANT
DURAPREP 26ML APPLICATOR (WOUND CARE) ×1 IMPLANT
GAUZE XEROFORM 1X8 LF (GAUZE/BANDAGES/DRESSINGS) IMPLANT
GLOVE BIO SURGEON STRL SZ7.5 (GLOVE) ×1 IMPLANT
GLOVE BIO SURGEON STRL SZ8 (GLOVE) ×1 IMPLANT
GLOVE BIOGEL PI IND STRL 7.5 (GLOVE) ×1 IMPLANT
GLOVE BIOGEL PI IND STRL 8 (GLOVE) ×1 IMPLANT
GLOVE BIOGEL PI INDICATOR 7.5 (GLOVE) ×1
GLOVE BIOGEL PI INDICATOR 8 (GLOVE) ×1
GOWN PREVENTION PLUS LG XLONG (DISPOSABLE) IMPLANT
GOWN STRL NON-REIN LRG LVL3 (GOWN DISPOSABLE) ×6 IMPLANT
KIT BASIN OR (CUSTOM PROCEDURE TRAY) ×2 IMPLANT
KIT ROOM TURNOVER OR (KITS) ×2 IMPLANT
MANIFOLD NEPTUNE II (INSTRUMENTS) ×2 IMPLANT
NEEDLE 27GAX1X1/2 (NEEDLE) IMPLANT
NS IRRIG 1000ML POUR BTL (IV SOLUTION) ×2 IMPLANT
PACK ORTHO EXTREMITY (CUSTOM PROCEDURE TRAY) ×2 IMPLANT
PAD ARMBOARD 7.5X6 YLW CONV (MISCELLANEOUS) ×4 IMPLANT
PAD CAST 3X4 CTTN HI CHSV (CAST SUPPLIES) IMPLANT
PADDING CAST COTTON 3X4 STRL (CAST SUPPLIES) ×2
PASSER SUT SWANSON 36MM LOOP (INSTRUMENTS) ×1 IMPLANT
RETRIEVER SUT HEWSON (MISCELLANEOUS) ×1 IMPLANT
SPONGE GAUZE 4X4 12PLY (GAUZE/BANDAGES/DRESSINGS) ×1 IMPLANT
STRIP CLOSURE SKIN 1/2X4 (GAUZE/BANDAGES/DRESSINGS) IMPLANT
SUCTION FRAZIER TIP 10 FR DISP (SUCTIONS) ×1 IMPLANT
SUCTION FRAZIER TIP 8 FR DISP (SUCTIONS) ×1
SUCTION TUBE FRAZIER 8FR DISP (SUCTIONS) IMPLANT
SUT ETHILON 3 0 PS 1 (SUTURE) ×1 IMPLANT
SUT ETHILON 5 0 PS 2 18 (SUTURE) IMPLANT
SUT FIBERWIRE #2 38 T-5 BLUE (SUTURE) ×2
SUT FIBERWIRE 2-0 18 17.9 3/8 (SUTURE) ×2
SUT PROLENE 3 0 PS 2 (SUTURE) IMPLANT
SUT PROLENE 4 0 SH DA (SUTURE) ×1 IMPLANT
SUT SILK 2 0 TIES 17X18 (SUTURE) ×2
SUT SILK 2-0 18XBRD TIE BLK (SUTURE) IMPLANT
SUT SILK 3 0 (SUTURE) ×2
SUT SILK 3-0 18XBRD TIE 12 (SUTURE) IMPLANT
SUT VIC AB 3-0 FS2 27 (SUTURE) IMPLANT
SUT VIC AB 3-0 SH 27 (SUTURE) ×2
SUT VIC AB 3-0 SH 27XBRD (SUTURE) IMPLANT
SUTURE FIBERWR #2 38 T-5 BLUE (SUTURE) IMPLANT
SUTURE FIBERWR 2-0 18 17.9 3/8 (SUTURE) IMPLANT
SYR CONTROL 10ML LL (SYRINGE) IMPLANT
TOWEL OR 17X24 6PK STRL BLUE (TOWEL DISPOSABLE) ×2 IMPLANT
TOWEL OR 17X26 10 PK STRL BLUE (TOWEL DISPOSABLE) ×2 IMPLANT
TUBE CONNECTING 12X1/4 (SUCTIONS) ×2 IMPLANT
UNDERPAD 30X30 INCONTINENT (UNDERPADS AND DIAPERS) ×3 IMPLANT
WATER STERILE IRR 1000ML POUR (IV SOLUTION) ×2 IMPLANT

## 2012-05-21 NOTE — Preoperative (Signed)
Beta Blockers   Reason not to administer Beta Blockers:Not Applicable 
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
## 2012-05-21 NOTE — Transfer of Care (Signed)
Immediate Anesthesia Transfer of Care Note  Patient: [name]  Procedure(s) Performed: Procedure(s) (LRB): DISTAL BICEPS TENDON REPAIR (Right)  Patient Location: PACU  Anesthesia Type: General  Level of Consciousness: awake, alert  and oriented  Airway & Oxygen Therapy: Patient Spontanous Breathing and Patient connected to nasal cannula oxygen  Post-op Assessment: Report given to PACU RN, Post -op Vital signs reviewed and stable and Patient moving all extremities  Post vital signs: Reviewed and stable  Complications: No apparent anesthesia complications

## 2012-05-21 NOTE — Anesthesia Preprocedure Evaluation (Addendum)
Anesthesia Evaluation  Patient identified by MRN, date of birth, ID band Patient awake    Reviewed: Allergy & Precautions, H&P , NPO status , Patient's Chart, lab work & pertinent test results  History of Anesthesia Complications Negative for: history of anesthetic complications  Airway Mallampati: I TM Distance: >3 FB Neck ROM: Full    Dental  (+) Teeth Intact and Dental Advisory Given   Pulmonary neg pulmonary ROS,  breath sounds clear to auscultation  Pulmonary exam normal       Cardiovascular negative cardio ROS  Rhythm:Regular Rate:Normal     Neuro/Psych PSYCHIATRIC DISORDERS Anxiety negative neurological ROS     GI/Hepatic negative GI ROS, Neg liver ROS,   Endo/Other  negative endocrine ROS  Renal/GU negative Renal ROS     Musculoskeletal   Abdominal   Peds  Hematology   Anesthesia Other Findings   Reproductive/Obstetrics                          Anesthesia Physical Anesthesia Plan  ASA: II  Anesthesia Plan: General   Post-op Pain Management:    Induction: Intravenous  Airway Management Planned: Oral ETT  Additional Equipment:   Intra-op Plan:   Post-operative Plan: Extubation in OR  Informed Consent: I have reviewed the patients History and Physical, chart, labs and discussed the procedure including the risks, benefits and alternatives for the proposed anesthesia with the patient or authorized representative who has indicated his/her understanding and acceptance.   Dental advisory given  Plan Discussed with: CRNA, Anesthesiologist and Surgeon  Anesthesia Plan Comments: (Pt refuses block)       Anesthesia Quick Evaluation

## 2012-05-21 NOTE — Anesthesia Postprocedure Evaluation (Signed)
Anesthesia Post Note  Patient: [name]  Procedure(s) Performed: Procedure(s) (LRB): DISTAL BICEPS TENDON REPAIR (Right)  Anesthesia type: general  Patient location: PACU  Post pain: Pain level controlled  Post assessment: Patient's Cardiovascular Status Stable  Last Vitals:  Filed Vitals:   05/21/12 1330  BP:   Pulse: 97  Temp:   Resp: 18    Post vital signs: Reviewed and stable  Level of consciousness: sedated  Complications: No apparent anesthesia complications

## 2012-05-21 NOTE — Anesthesia Procedure Notes (Signed)
Procedure Name: Intubation Date/Time: 05/21/2012 7:59 AM Performed by: Rossie Muskrat L Pre-anesthesia Checklist: Patient identified, Timeout performed, Emergency Drugs available, Suction available and Patient being monitored Patient Re-evaluated:Patient Re-evaluated prior to inductionOxygen Delivery Method: Circle system utilized Preoxygenation: Pre-oxygenation with 100% oxygen Intubation Type: IV induction Ventilation: Mask ventilation without difficulty Laryngoscope Size: Miller and 2 Tube type: Oral Tube size: 7.5 mm Number of attempts: 1 Airway Equipment and Method: Stylet Placement Confirmation: ETT inserted through vocal cords under direct vision,  breath sounds checked- equal and bilateral and positive ETCO2 Secured at: 23 cm Tube secured with: Tape Dental Injury: Teeth and Oropharynx as per pre-operative assessment
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
## 2012-05-21 NOTE — Brief Op Note (Signed)
05/21/2012  12:17 PM  PATIENT:  Elenora Fender  46 y.o. male  PRE-OPERATIVE DIAGNOSIS:  Distal bicep rupture, right  POST-OPERATIVE DIAGNOSIS:  distal bicep tendon rupture, right  PROCEDURE:  Procedure(s) (LRB): DISTAL BICEPS TENDON REPAIR (Right)  SURGEON:  Surgeon(s) and Role:    * Budd Palmer, MD - Primary  PHYSICIAN ASSISTANT: Montez Morita, [facility]  ANESTHESIA:   general  EBL:  Total I/O In: 1000 [I.V.:1000] Out: -   BLOOD ADMINISTERED:none  DRAINS: none   LOCAL MEDICATIONS USED:  NONE  SPECIMEN:  No Specimen  DISPOSITION OF SPECIMEN:  N/A  COUNTS:  YES  TOURNIQUET:   Total Tourniquet Time Documented: Upper Arm (Right) - 121 minutes  DICTATION: .Other Dictation: Dictation Number [id number]  PLAN OF CARE: Admit to inpatient   PATIENT DISPOSITION:  PACU - hemodynamically stable.   Delay start of Pharmacological VTE agent (>24hrs) due to surgical blood loss or risk of bleeding: no

## 2012-05-22 ENCOUNTER — Encounter: Payer: Non-veteran care | Admitting: Physical Therapy

## 2012-05-22 ENCOUNTER — Other Ambulatory Visit: Payer: Non-veteran care

## 2012-05-22 ENCOUNTER — Encounter (HOSPITAL_COMMUNITY): Payer: Self-pay | Admitting: Orthopedic Surgery

## 2012-05-29 ENCOUNTER — Encounter: Payer: Non-veteran care | Admitting: Physical Therapy

## 2012-06-20 ENCOUNTER — Encounter: Payer: Self-pay | Admitting: Vascular Surgery

## 2012-08-07 ENCOUNTER — Other Ambulatory Visit: Payer: Self-pay | Admitting: Orthopedic Surgery

## 2012-08-07 DIAGNOSIS — M25522 Pain in left elbow: Secondary | ICD-10-CM

## 2012-08-10 ENCOUNTER — Ambulatory Visit
Admission: RE | Admit: 2012-08-10 | Discharge: 2012-08-10 | Disposition: A | Discharge status: Home / Self Care | Payer: Medicare Other | Source: Ambulatory Visit | Attending: Orthopedic Surgery | Admitting: Orthopedic Surgery

## 2012-08-10 DIAGNOSIS — M25522 Pain in left elbow: Secondary | ICD-10-CM

## 2014-01-07 IMAGING — CR DG CHEST 2V
2 series · 2 of 2 positions shown · non-contrast
Comparison: Chest radiograph 02/07/2011

CLINICAL DATA: Preop right biopsy repair

CHEST - 2 VIEW

[view not recorded (1 of 2)]
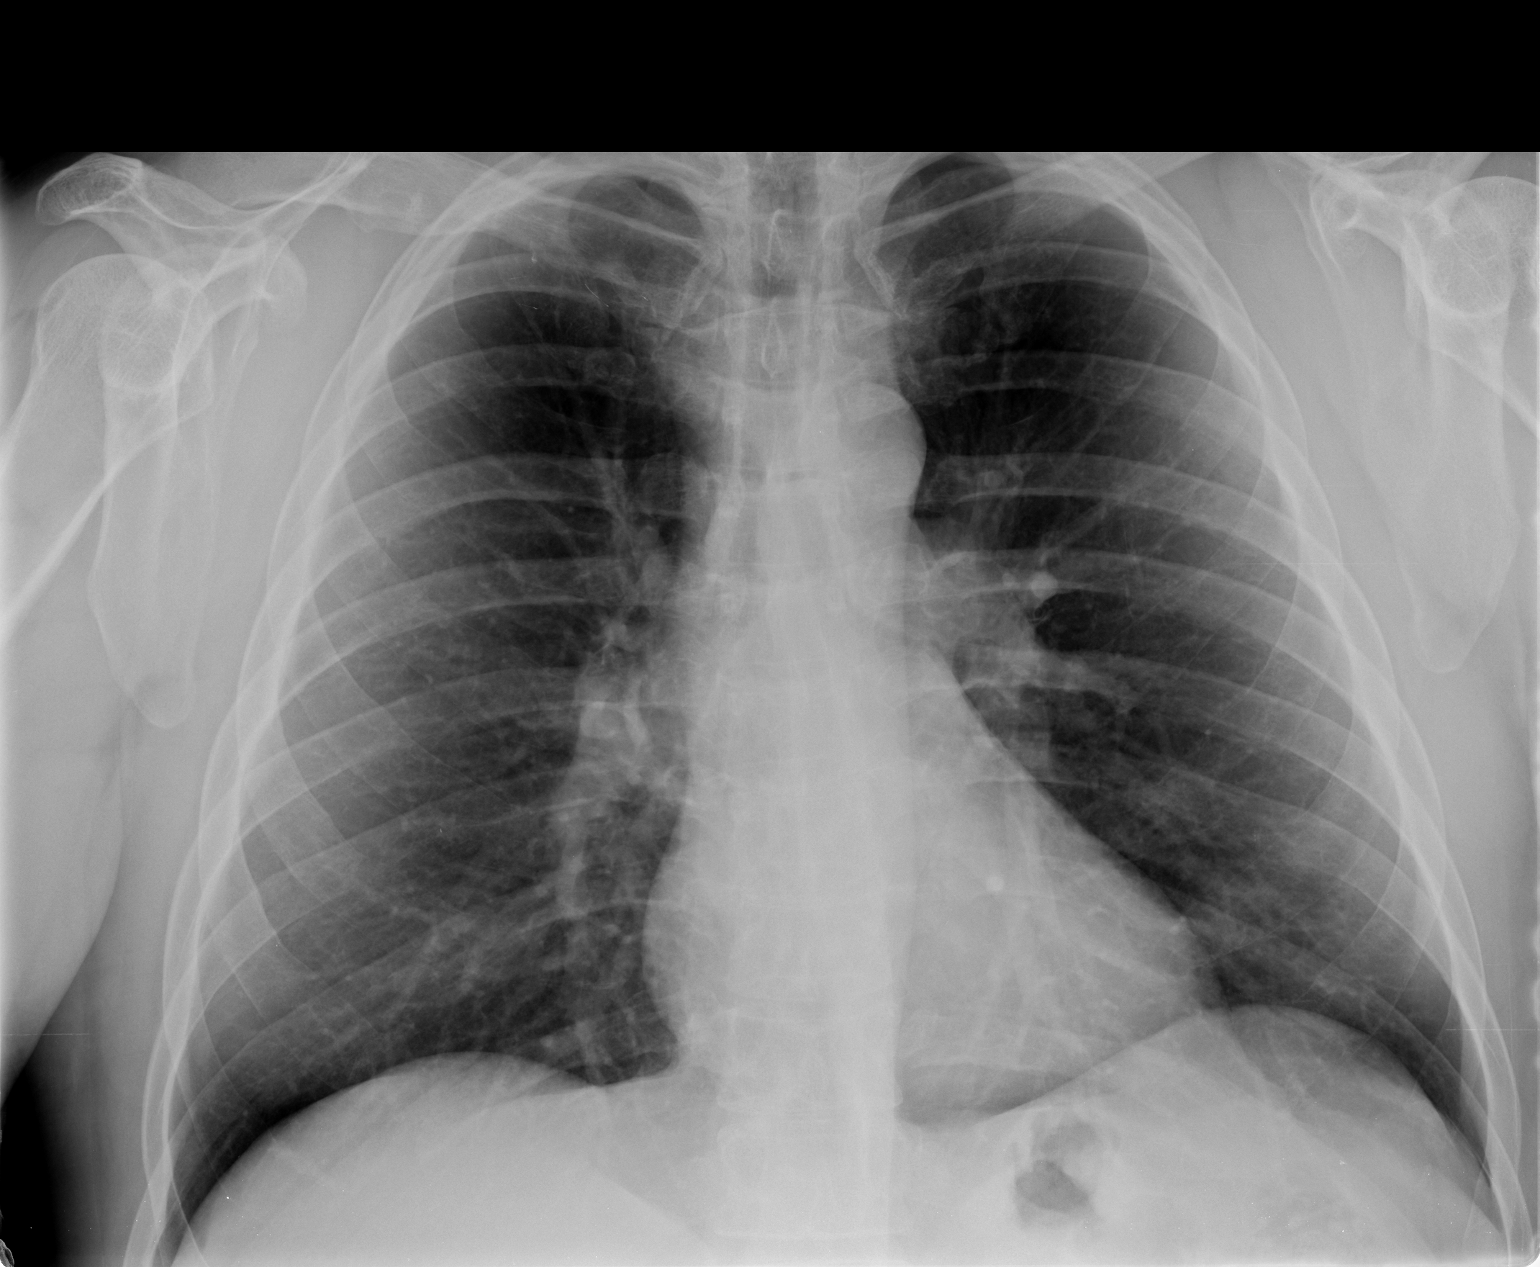

[view not recorded (2 of 2)]
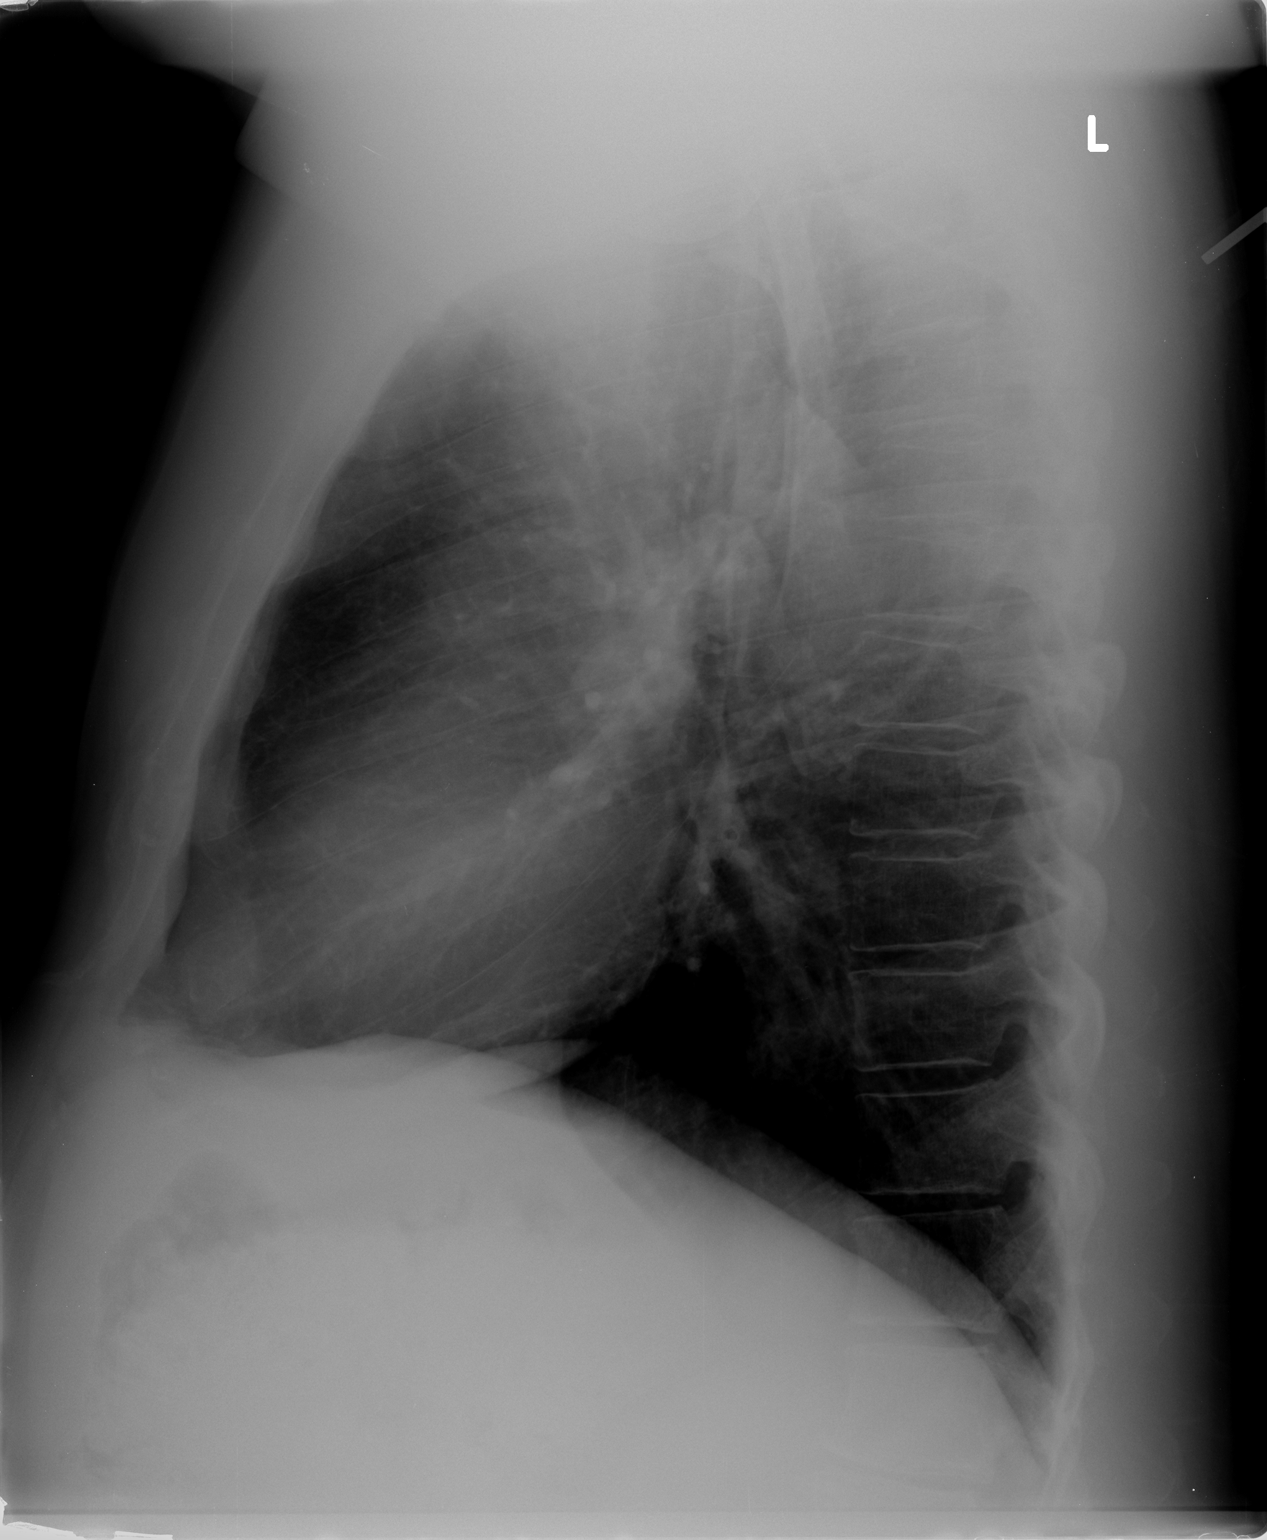

[2 of 2 positions shown; findings below may reference images not displayed]

FINDINGS: Normal mediastinum and cardiac silhouette.  Normal
pulmonary  vasculature.  No evidence of effusion, infiltrate, or
pneumothorax.  No acute bony abnormality.
IMPRESSION: No acute cardiopulmonary process.

## 2019-03-14 ENCOUNTER — Other Ambulatory Visit: Payer: Self-pay

## 2019-03-14 ENCOUNTER — Emergency Department (HOSPITAL_COMMUNITY)
Admission: EM | Admit: 2019-03-14 | Discharge: 2019-03-15 | Disposition: A | Discharge status: Home / Self Care | Payer: Non-veteran care | Attending: Emergency Medicine | Admitting: Emergency Medicine

## 2019-03-14 ENCOUNTER — Encounter (HOSPITAL_COMMUNITY): Payer: Self-pay | Admitting: *Deleted

## 2019-03-14 DIAGNOSIS — Z8651 Personal history of combat and operational stress reaction: Secondary | ICD-10-CM | POA: Diagnosis not present

## 2019-03-14 DIAGNOSIS — M7918 Myalgia, other site: Secondary | ICD-10-CM | POA: Diagnosis not present

## 2019-03-14 DIAGNOSIS — R509 Fever, unspecified: Secondary | ICD-10-CM | POA: Insufficient documentation

## 2019-03-14 DIAGNOSIS — R05 Cough: Secondary | ICD-10-CM | POA: Diagnosis present

## 2019-03-14 DIAGNOSIS — R0981 Nasal congestion: Secondary | ICD-10-CM | POA: Diagnosis not present

## 2019-03-14 DIAGNOSIS — J069 Acute upper respiratory infection, unspecified: Secondary | ICD-10-CM | POA: Insufficient documentation

## 2019-03-14 DIAGNOSIS — F17210 Nicotine dependence, cigarettes, uncomplicated: Secondary | ICD-10-CM | POA: Diagnosis not present

## 2019-03-14 DIAGNOSIS — Z20828 Contact with and (suspected) exposure to other viral communicable diseases: Secondary | ICD-10-CM | POA: Insufficient documentation

## 2019-03-14 NOTE — ED Notes (Signed)
The pt had a chest xray  At [facility]  Today he wants a carona test

## 2019-03-14 NOTE — ED Triage Notes (Signed)
Since Sunday he has had a cough low grade fever  Headache  Backache  Chills  He was seen at [facility] today and had tests that did not include a carona test  They told him he needed one

## 2019-03-15 NOTE — ED Notes (Signed)
Patient verbalizes understanding of discharge instructions. Opportunity for questioning and answers were provided. Armband removed by staff, pt discharged from ED.  
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
## 2019-03-16 LAB — NOVEL CORONAVIRUS, NAA (HOSP ORDER, SEND-OUT TO REF LAB; TAT 18-24 HRS): SARS-CoV-2, NAA: NOT DETECTED

## 2024-05-21 ENCOUNTER — Encounter (HOSPITAL_COMMUNITY): Payer: Self-pay

## 2024-05-21 ENCOUNTER — Ambulatory Visit (HOSPITAL_COMMUNITY)
Admission: EM | Admit: 2024-05-21 | Discharge: 2024-05-21 | Disposition: A | Discharge status: Home / Self Care | Attending: Family Medicine | Admitting: Family Medicine

## 2024-05-21 ENCOUNTER — Ambulatory Visit (INDEPENDENT_AMBULATORY_CARE_PROVIDER_SITE_OTHER)

## 2024-05-21 DIAGNOSIS — R600 Localized edema: Secondary | ICD-10-CM

## 2024-05-21 DIAGNOSIS — L03115 Cellulitis of right lower limb: Secondary | ICD-10-CM | POA: Diagnosis not present

## 2024-05-21 DIAGNOSIS — M79661 Pain in right lower leg: Secondary | ICD-10-CM

## 2024-05-21 LAB — COMPREHENSIVE METABOLIC PANEL WITH GFR
ALT: 17 U/L (ref 0–44)
AST: 21 U/L (ref 15–41)
Albumin: 3.5 g/dL (ref 3.5–5.0)
Alkaline Phosphatase: 64 U/L (ref 38–126)
Anion gap: 10 (ref 5–15)
BUN: 14 mg/dL (ref 6–20)
CO2: 29 mmol/L (ref 22–32)
Calcium: 9 mg/dL (ref 8.9–10.3)
Chloride: 101 mmol/L (ref 98–111)
Creatinine, Ser: 0.71 mg/dL (ref 0.61–1.24)
GFR, Estimated: >60 mL/min (ref >60)
Glucose, Bld: 93 mg/dL (ref 70–99)
Potassium: 3.6 mmol/L (ref 3.5–5.1)
Sodium: 140 mmol/L (ref 135–145)
Total Bilirubin: 0.4 mg/dL (ref 0.0–1.2)
Total Protein: 6.5 g/dL (ref 6.5–8.1)

## 2024-05-21 LAB — CBC WITH DIFFERENTIAL/PLATELET
Abs Immature Granulocytes: 0.02 10*3/uL (ref 0.00–0.07)
Basophils Absolute: 0.1 10*3/uL (ref 0.0–0.1)
Basophils Relative: 1 %
Eosinophils Absolute: 0.2 10*3/uL (ref 0.0–0.5)
Eosinophils Relative: 3 %
HCT: 38.9 % — ABNORMAL LOW (ref 39.0–52.0)
Hemoglobin: 12.8 g/dL — ABNORMAL LOW (ref 13.0–17.0)
Immature Granulocytes: 0 %
Lymphocytes Relative: 43 %
Lymphs Abs: 3.3 10*3/uL (ref 0.7–4.0)
MCH: 29.5 pg (ref 26.0–34.0)
MCHC: 32.9 g/dL (ref 30.0–36.0)
MCV: 89.6 fL (ref 80.0–100.0)
Monocytes Absolute: 0.6 10*3/uL (ref 0.1–1.0)
Monocytes Relative: 8 %
Neutro Abs: 3.4 10*3/uL (ref 1.7–7.7)
Neutrophils Relative %: 45 %
Platelets: 385 10*3/uL (ref 150–400)
RBC: 4.34 MIL/uL (ref 4.22–5.81)
RDW: 12.5 % (ref 11.5–15.5)
WBC: 7.6 10*3/uL (ref 4.0–10.5)
nRBC: 0 % (ref 0.0–0.2)

## 2024-05-21 LAB — BRAIN NATRIURETIC PEPTIDE: B Natriuretic Peptide: 9 pg/mL (ref 0.0–100.0)

## 2024-05-21 MED ORDER — FUROSEMIDE 20 MG PO TABS
ORAL_TABLET | ORAL | 0 refills | Status: AC
Start: 2024-05-21 — End: ?

## 2024-05-21 MED ORDER — CEFTRIAXONE SODIUM 1 G IJ SOLR
INTRAMUSCULAR | Status: AC
  Filled 2024-05-21: qty 10

## 2024-05-21 MED ORDER — CEFTRIAXONE SODIUM 1 G IJ SOLR
1.0000 g | Freq: Once | INTRAMUSCULAR | Status: AC
  Administered 2024-05-21: 1 g via INTRAMUSCULAR

## 2024-05-21 MED ORDER — LIDOCAINE HCL (PF) 1 % IJ SOLN
INTRAMUSCULAR | Status: AC
  Filled 2024-05-21: qty 2

## 2024-05-21 MED ORDER — AMOXICILLIN-POT CLAVULANATE 875-125 MG PO TABS
1.0000 | ORAL_TABLET | Freq: Two times a day (BID) | ORAL | 0 refills | Status: AC
Start: 2024-05-21 — End: ?
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
## 2024-05-21 NOTE — Discharge Instructions (Addendum)
 Swelling happens when fluid collects in small spaces around tissues and organs inside the body. Another word for swelling is edema. Some common parts of the body where people can have swelling are the lower legs or hands. This typically is worse in the areas of the body that are closest to the ground (because of gravity)  Symptoms of swelling can include puffiness of the skin, which can cause the skin to look stretched and shiny. This often occurs with swelling in the lower legs and can be worse after you sit or stand for a long time.  Treatment of edema includes several components: treatment of the underlying cause (if possible), reducing the amount of salt (sodium) in your diet, and, in many cases, use of a medication called a diuretic to eliminate excess fluid. Using compression stockings and elevating the legs may also be recommended.   I am treating you for likely cellulitis of your leg. Meds ordered this encounter  Medications   cefTRIAXone (ROCEPHIN) injection 1 g   amoxicillin-clavulanate (AUGMENTIN) 875-125 MG tablet    Sig: Take 1 tablet by mouth every 12 (twelve) hours.    Dispense:  20 tablet    Refill:  0   Go to the Emergency Department if this worsens or does not respond to the antibiotics over the next 24-48 hours.  You have had labs (blood work) sent today. We will call you with any significant abnormalities or if there is need to begin or change treatment or pursue further follow up.  You may also review your test results online through MyChart. If you do not have a MyChart account, instructions to sign up should be on your discharge paperwork.
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
## 2024-05-22 ENCOUNTER — Ambulatory Visit (HOSPITAL_COMMUNITY): Payer: Self-pay
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
## 2024-11-05 ENCOUNTER — Ambulatory Visit (HOSPITAL_COMMUNITY)
Admission: EM | Admit: 2024-11-05 | Discharge: 2024-11-05 | Disposition: A | Discharge status: Home / Self Care | Source: Home / Self Care | Attending: Family Medicine | Admitting: Family Medicine

## 2024-11-05 ENCOUNTER — Encounter (HOSPITAL_COMMUNITY): Payer: Self-pay

## 2024-11-05 DIAGNOSIS — N5089 Other specified disorders of the male genital organs: Secondary | ICD-10-CM | POA: Diagnosis not present

## 2024-11-05 NOTE — ED Triage Notes (Signed)
 Pt has c/o swollen left testicle x 4 days. Denies pain, and hasn't taken any medications.

## 2024-11-06 ENCOUNTER — Ambulatory Visit (HOSPITAL_COMMUNITY)
Admission: RE | Admit: 2024-11-06 | Discharge: 2024-11-06 | Disposition: A | Discharge status: Home / Self Care | Source: Ambulatory Visit | Attending: Family Medicine | Admitting: Family Medicine

## 2024-11-06 ENCOUNTER — Telehealth (HOSPITAL_COMMUNITY): Payer: Self-pay

## 2024-11-06 DIAGNOSIS — N5089 Other specified disorders of the male genital organs: Secondary | ICD-10-CM | POA: Insufficient documentation

## 2024-11-06 DIAGNOSIS — N433 Hydrocele, unspecified: Secondary | ICD-10-CM | POA: Insufficient documentation

## 2024-11-06 NOTE — Telephone Encounter (Signed)
 Patient informed and verbalized understanding.  Patient able to provide teach back of appointment time and where to report for imaging.

## 2024-11-06 NOTE — Telephone Encounter (Signed)
 Called centralized scheduling to schedule Patient for an US  Scrotum W/Doppler.  Patient scheduled for 11/06/24 at 4:00 pm (1600) at [facility].
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
## 2024-11-07 ENCOUNTER — Ambulatory Visit (HOSPITAL_COMMUNITY): Payer: Self-pay

## 2024-12-08 ENCOUNTER — Ambulatory Visit (HOSPITAL_COMMUNITY)
Admission: EM | Admit: 2024-12-08 | Discharge: 2024-12-08 | Disposition: A | Discharge status: Home / Self Care | Attending: Nurse Practitioner | Admitting: Nurse Practitioner

## 2024-12-08 ENCOUNTER — Encounter (HOSPITAL_COMMUNITY): Payer: Self-pay

## 2024-12-08 DIAGNOSIS — L03115 Cellulitis of right lower limb: Secondary | ICD-10-CM | POA: Insufficient documentation

## 2024-12-08 LAB — CBC WITH DIFFERENTIAL/PLATELET
Abs Immature Granulocytes: 0.44 K/uL — ABNORMAL HIGH (ref 0.00–0.07)
Basophils Absolute: 0.1 K/uL (ref 0.0–0.1)
Basophils Relative: 1 %
Eosinophils Absolute: 0.3 K/uL (ref 0.0–0.5)
Eosinophils Relative: 2 %
HCT: 37.9 % — ABNORMAL LOW (ref 39.0–52.0)
Hemoglobin: 12.1 g/dL — ABNORMAL LOW (ref 13.0–17.0)
Immature Granulocytes: 3 %
Lymphocytes Relative: 24 %
Lymphs Abs: 3.4 K/uL (ref 0.7–4.0)
MCH: 29.2 pg (ref 26.0–34.0)
MCHC: 31.9 g/dL (ref 30.0–36.0)
MCV: 91.3 fL (ref 80.0–100.0)
Monocytes Absolute: 1.1 K/uL — ABNORMAL HIGH (ref 0.1–1.0)
Monocytes Relative: 8 %
Neutro Abs: 8.8 K/uL — ABNORMAL HIGH (ref 1.7–7.7)
Neutrophils Relative %: 62 %
Platelets: 616 K/uL — ABNORMAL HIGH (ref 150–400)
RBC: 4.15 MIL/uL — ABNORMAL LOW (ref 4.22–5.81)
RDW: 12.8 % (ref 11.5–15.5)
WBC: 14 K/uL — ABNORMAL HIGH (ref 4.0–10.5)
nRBC: 0 % (ref 0.0–0.2)

## 2024-12-08 LAB — COMPREHENSIVE METABOLIC PANEL WITH GFR
ALT: 28 U/L (ref 0–44)
AST: 35 U/L (ref 15–41)
Albumin: 3.4 g/dL — ABNORMAL LOW (ref 3.5–5.0)
Alkaline Phosphatase: 87 U/L (ref 38–126)
Anion gap: 14 (ref 5–15)
BUN: 9 mg/dL (ref 6–20)
CO2: 28 mmol/L (ref 22–32)
Calcium: 8.7 mg/dL — ABNORMAL LOW (ref 8.9–10.3)
Chloride: 98 mmol/L (ref 98–111)
Creatinine, Ser: 0.61 mg/dL (ref 0.61–1.24)
GFR, Estimated: >60 mL/min
Glucose, Bld: 72 mg/dL (ref 70–99)
Potassium: 4.4 mmol/L (ref 3.5–5.1)
Sodium: 140 mmol/L (ref 135–145)
Total Bilirubin: 0.3 mg/dL (ref 0.0–1.2)
Total Protein: 7.4 g/dL (ref 6.5–8.1)

## 2024-12-08 MED ORDER — KETOROLAC TROMETHAMINE 60 MG/2ML IM SOLN
INTRAMUSCULAR | Status: AC
  Filled 2024-12-08: qty 2

## 2024-12-08 MED ORDER — SULFAMETHOXAZOLE-TRIMETHOPRIM 800-160 MG PO TABS: 1.0000 | ORAL_TABLET | Freq: Two times a day (BID) | ORAL | 0 refills | Status: AC

## 2024-12-08 MED ORDER — CEPHALEXIN 500 MG PO CAPS: 500.0000 mg | ORAL_CAPSULE | Freq: Three times a day (TID) | ORAL | 0 refills | Status: AC

## 2024-12-08 MED ORDER — CEFTRIAXONE SODIUM 1 G IJ SOLR
INTRAMUSCULAR | Status: AC
  Filled 2024-12-08: qty 10

## 2024-12-08 MED ORDER — LIDOCAINE HCL (PF) 1 % IJ SOLN
INTRAMUSCULAR | Status: AC
  Filled 2024-12-08: qty 2

## 2024-12-08 MED ORDER — CEFTRIAXONE SODIUM 1 G IJ SOLR
1.0000 g | Freq: Once | INTRAMUSCULAR | Status: AC
  Administered 2024-12-08: 1 g via INTRAMUSCULAR

## 2024-12-08 MED ORDER — KETOROLAC TROMETHAMINE 60 MG/2ML IM SOLN
60.0000 mg | Freq: Once | INTRAMUSCULAR | Status: AC
  Administered 2024-12-08: 60 mg via INTRAMUSCULAR

## 2024-12-08 MED ORDER — KETOROLAC TROMETHAMINE 10 MG PO TABS: 10.0000 mg | ORAL_TABLET | Freq: Four times a day (QID) | ORAL | 0 refills | Status: AC | PRN

## 2024-12-08 NOTE — Discharge Instructions (Addendum)
 You were seen today for an infection of the skin on your right leg called cellulitis. This caused swelling, redness, warmth, and drainage. You received antibiotic and pain-relief injections in the clinic to start treatment quickly, and you were prescribed two oral antibiotics to take at home. It is important to take both antibiotics exactly as prescribed and finish the full course, even if you start to feel better. You may take the prescribed pain medication as needed. While taking this medication, do not use over-the-counter anti-inflammatories such as aspirin, Motrin, ibuprofen, or Aleve, as this may increase the risk of side effects. If needed, you may take Tylenol  (acetaminophen ) 1000 mg every six hours for additional pain relief. This equals two 500 mg tablets at a time. Be careful not to take more than 4000 mg of Tylenol  in a 24-hour period.  To help with healing, keep your leg elevated as much as possible and limit prolonged standing or walking. The red area on your leg was marked with a pen today so you can monitor it. Some soreness is expected, but the redness and swelling should gradually improve. Keep the area clean and dry and watch for any changes.  You will be contacted if your wound culture shows a need to change antibiotics. Go to the emergency department immediately if the redness spreads beyond the marked area, if you develop a fever of 100.79F or higher, chills, worsening pain, increasing swelling, weakness, dizziness, or if you begin to feel generally unwell.

## 2024-12-08 NOTE — ED Triage Notes (Addendum)
 Pt hasw c/o right leg swelling and knee pain x 3 days. Pt states he had cellulitis in the past, but it flared up 3 days ago. Pt denies medication use at home. Pt would also like to scars looked at on his shoulders, and left side.
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
## 2024-12-09 ENCOUNTER — Ambulatory Visit (HOSPITAL_COMMUNITY): Payer: Self-pay

## 2024-12-12 LAB — AEROBIC CULTURE W GRAM STAIN (SUPERFICIAL SPECIMEN): Gram Stain: NONE SEEN

## 2024-12-13 NOTE — Progress Notes (Signed)
 Wound culture positive for bacterial growth with organism sensitive to antibiotics prescribed at urgent care visit. Patient should complete full prescribed antibiotic course to ensure complete treatment of infection. Recommend follow-up with primary care provider for concerns regarding healing or additional issues.
